# Patient Record
Sex: Male | Born: 2000 | Hispanic: No | Marital: Single | Smoking: Never smoker
Health system: Southern US, Community
[De-identification: ages and names within clinical notes are randomized; demographics above are authoritative.]

---
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
## 2018-07-31 IMAGING — CT CT CERVICAL SPINE W/O CM
3 of 5 series · 13 of 33 positions shown, 15 images · non-contrast
Comparison: None.

CLINICAL DATA: Neck injury on boat 2 days ago. Initial encounter.

EXAM:
CT CERVICAL SPINE WITHOUT CONTRAST
TECHNIQUE: Multidetector CT imaging of the cervical spine was performed without
intravenous contrast. Multiplanar CT image reconstructions were also
generated.

[Series 4: c_spine 2.0 st · axial · 0.37mm/px · z∈[+997,+1129]mm · 5 of 100 slices shown, 7 images]
[im 17/100  soft-tissue]
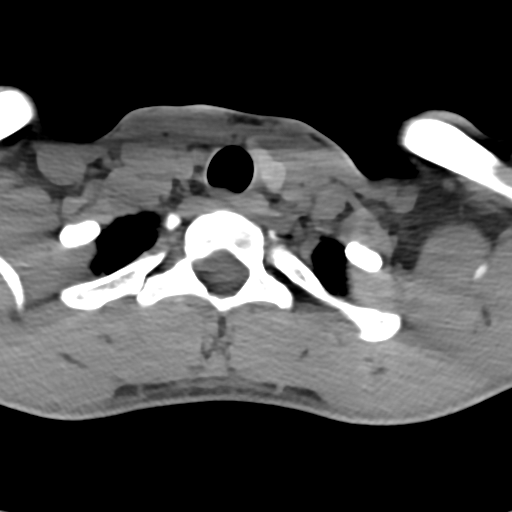
[im 17/100  bone]
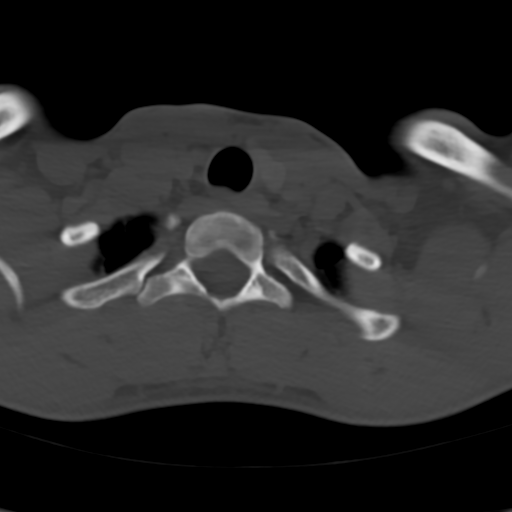
[im 34/100  bone]
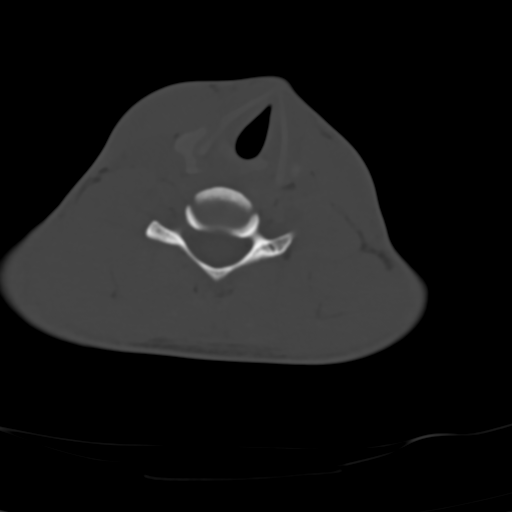
[im 50/100  bone]
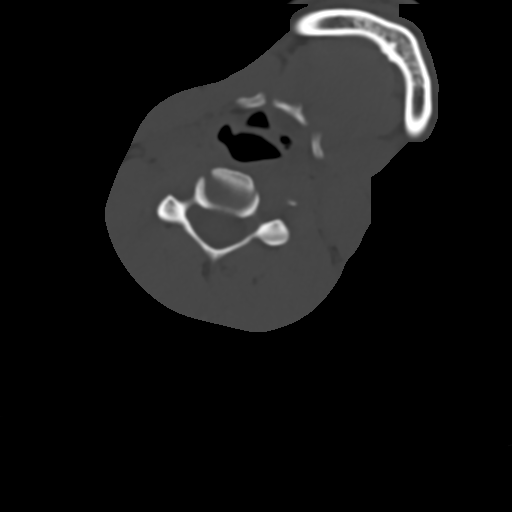
[im 67/100  bone]
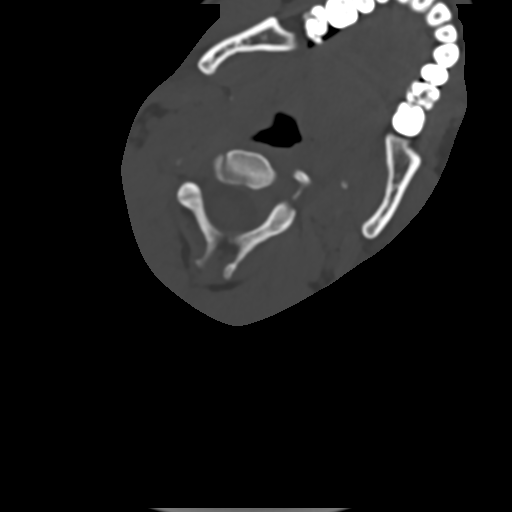
[im 83/100  soft-tissue]
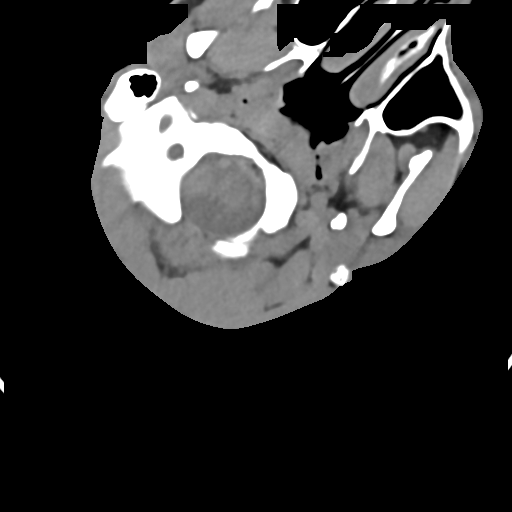
[im 83/100  bone]
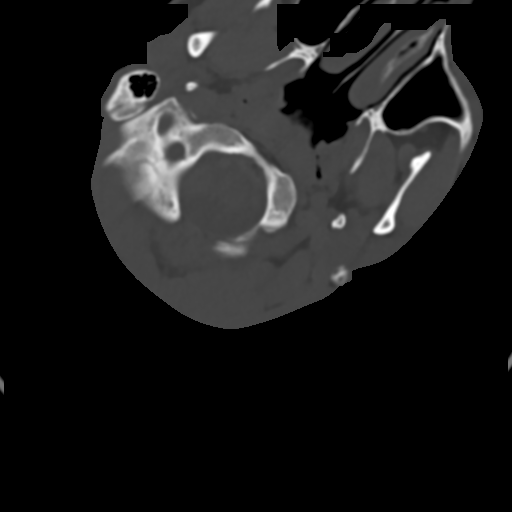

[Series 9: c_spine 2.0 cor bone · coronal · 0.26mm/px · 3 of 52 slices shown]
[im 11/52  bone]
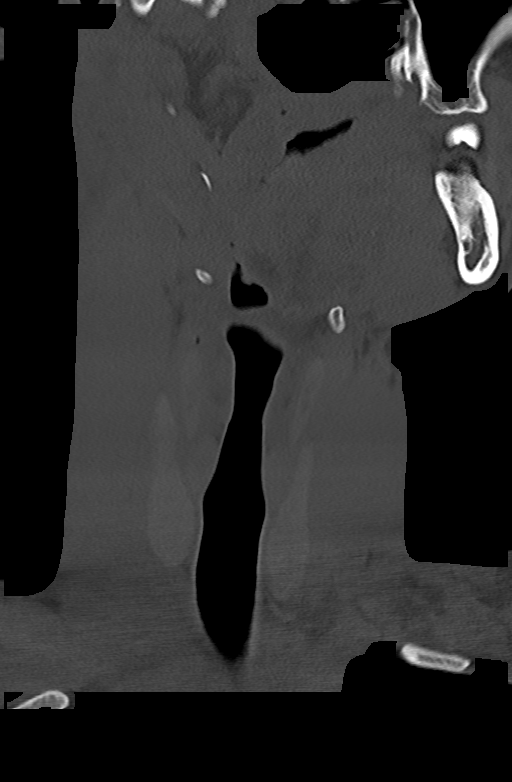
[im 21/52  bone]
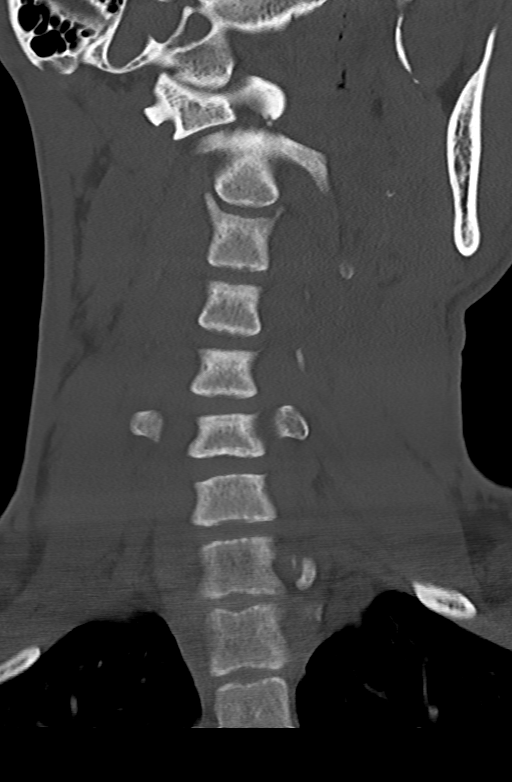
[im 31/52  bone]
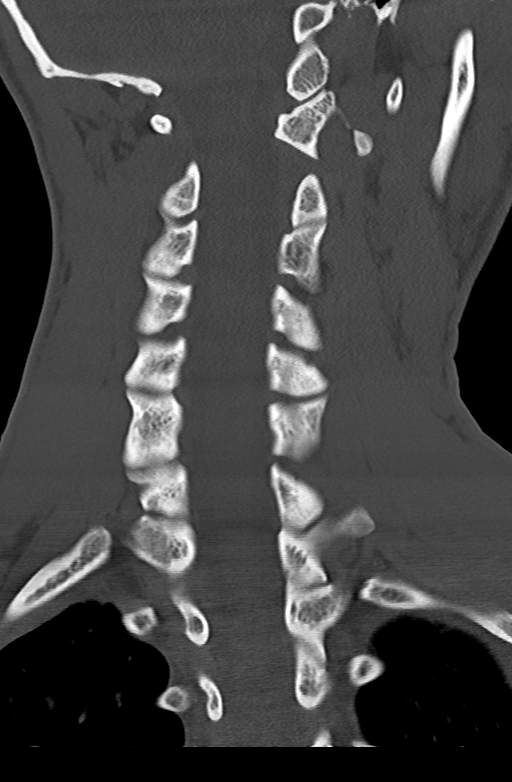

[Series 12: c_spine 2.0 sag bonelower · sagittal · 0.25mm/px · 5 of 57 slices shown]
[im 10/57  bone]
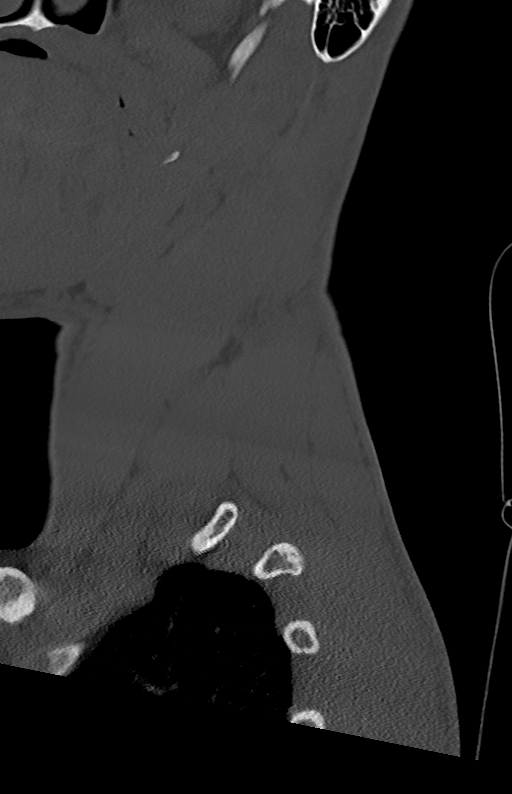
[im 19/57  bone]
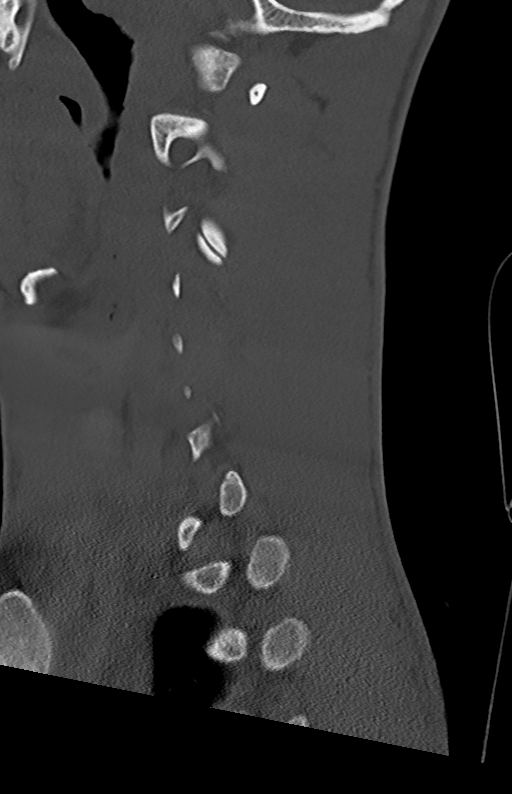
[im 29/57  bone]
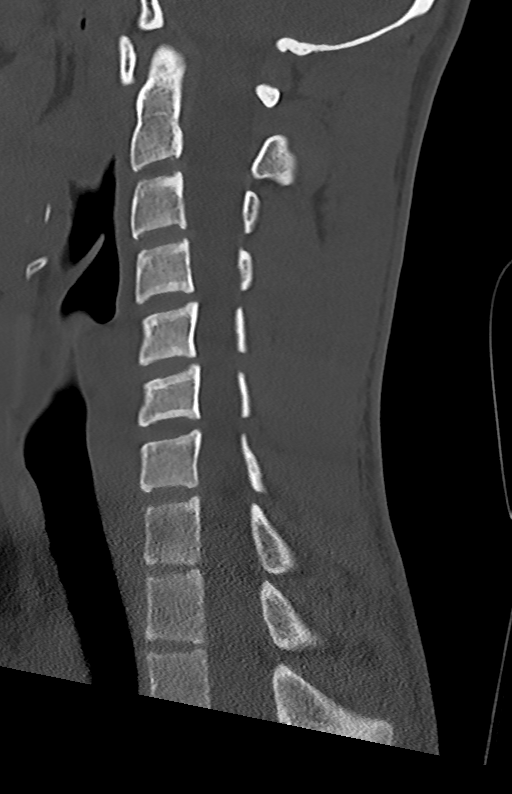
[im 38/57  bone]
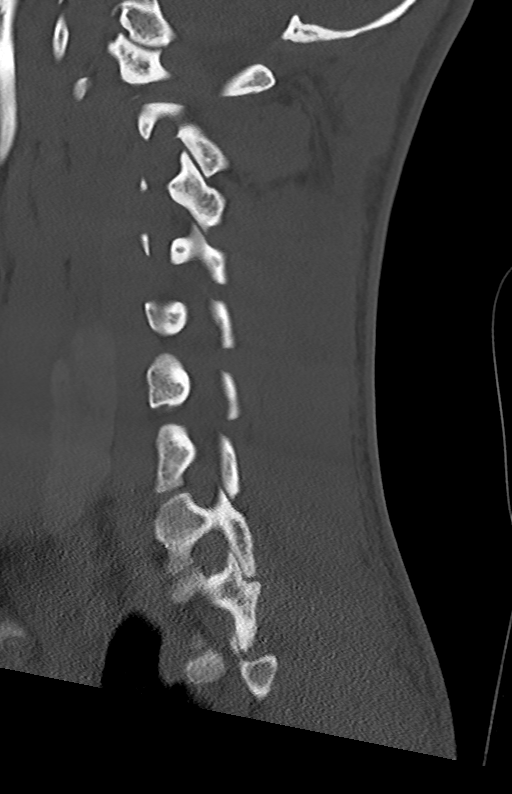
[im 47/57  bone]
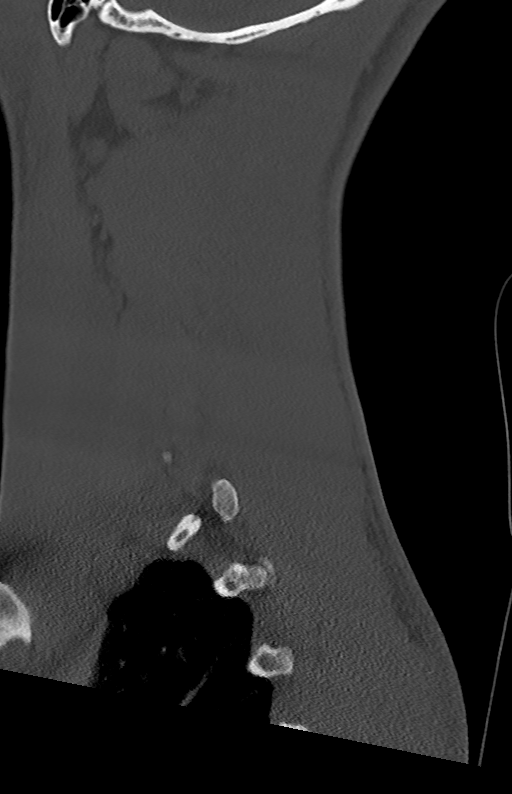

[13 of 33 positions shown; findings below may reference images not displayed]

FINDINGS: Alignment: C1 is rotated on C2 to the left with anterior subluxation
of the right facet and posterior subluxation of the left facet
measuring up to 7 mm (measurement taken on sagittal reformats
between the anterior margins of the right articular surfaces). The
predental space is normal diameter (2 mm) and lateral atlantodental
spaces are symmetric. Normal atlanto occipital alignment.

Skull base and vertebrae: Negative for fracture.

Soft tissues and spinal canal: No prevertebral fluid or swelling. No
visible canal hematoma.

Disc levels:  No degenerative changes

Upper chest: Negative
IMPRESSION: Assuming fixed torticollis, there are findings of atlantoaxial
rotatory displacement without listhesis or fracture.

## 2018-11-13 IMAGING — DX DG ANKLE COMPLETE 3+V*R*
3 series · 4 of 4 positions shown · non-contrast
Comparison: None.

CLINICAL DATA: Pain at lateral RIGHT ankle and heel after jumping
off the back of his truck yesterday

EXAM:
RIGHT ANKLE - COMPLETE 3+ VIEW

[dg ankle complete right (1 of 3)]
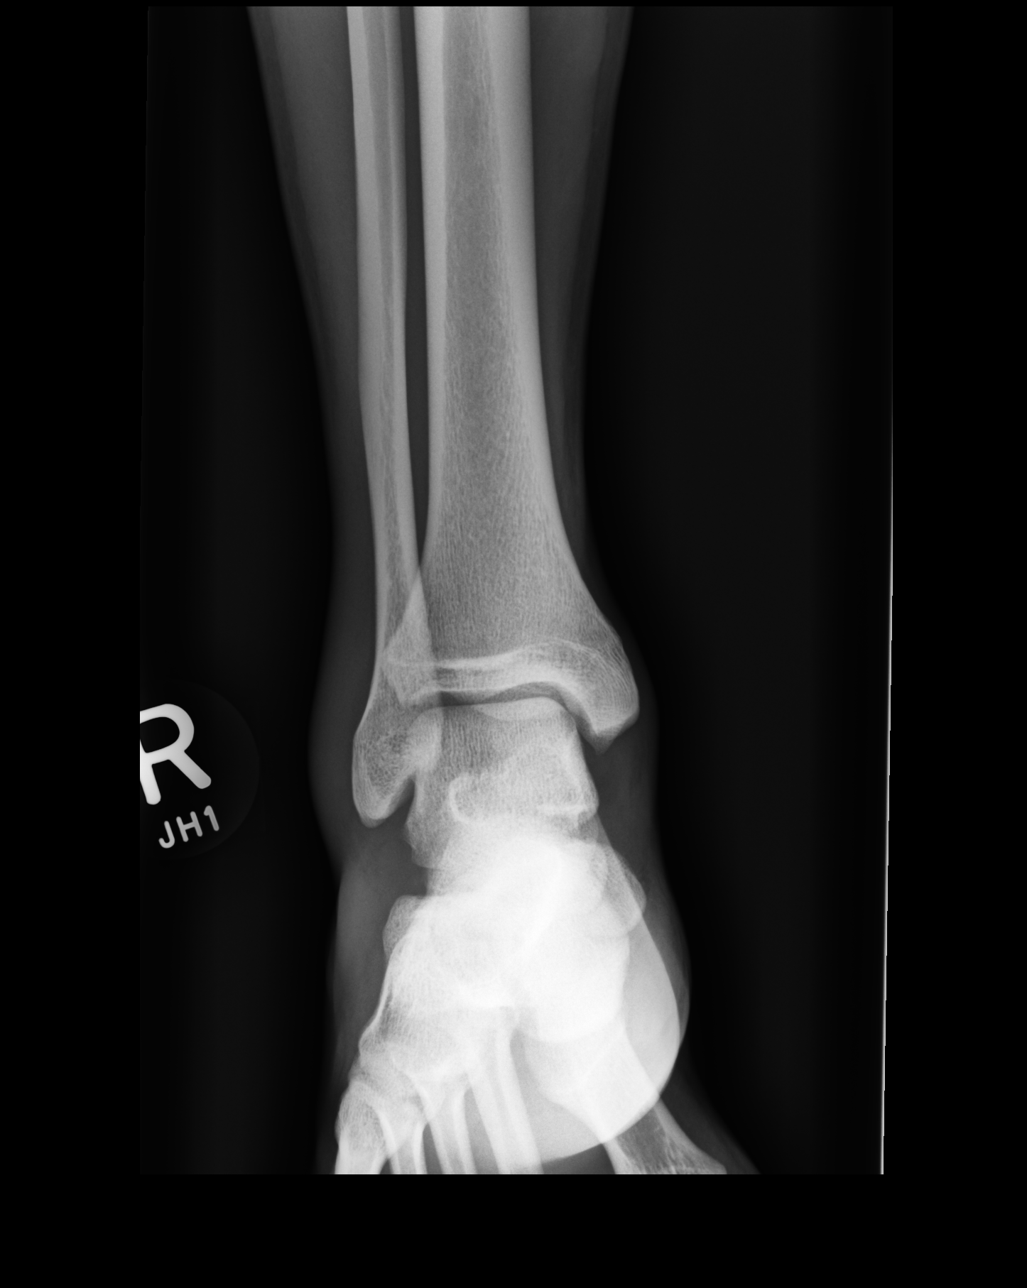

[dg ankle complete right (2 of 3)]
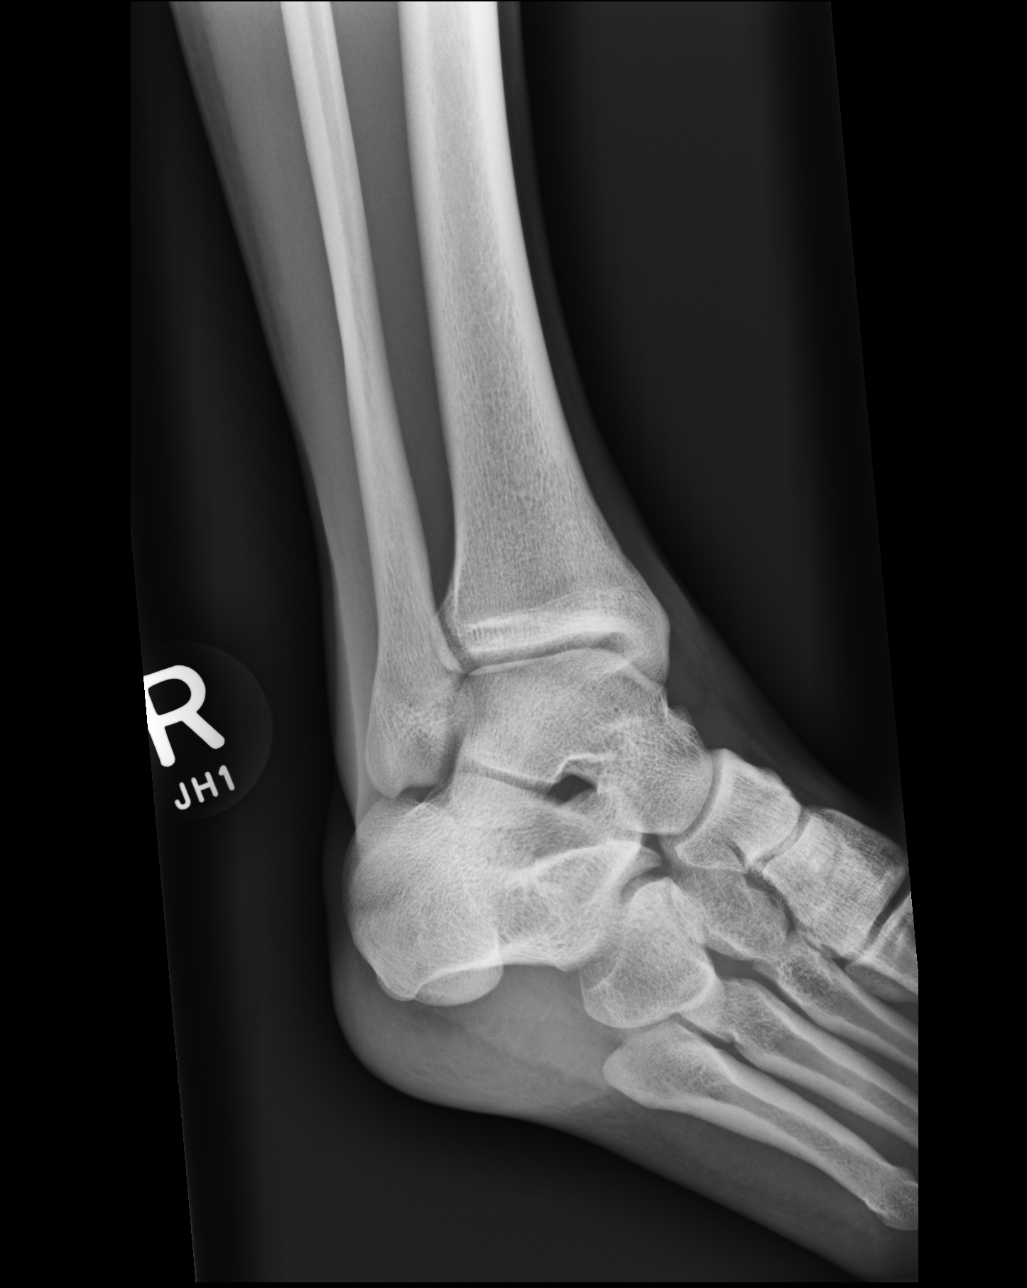

[Series 3: dg ankle complete right · right · 0.14mm/px · 2 of 2 slices shown (3 of 3)]
[im 1/2]
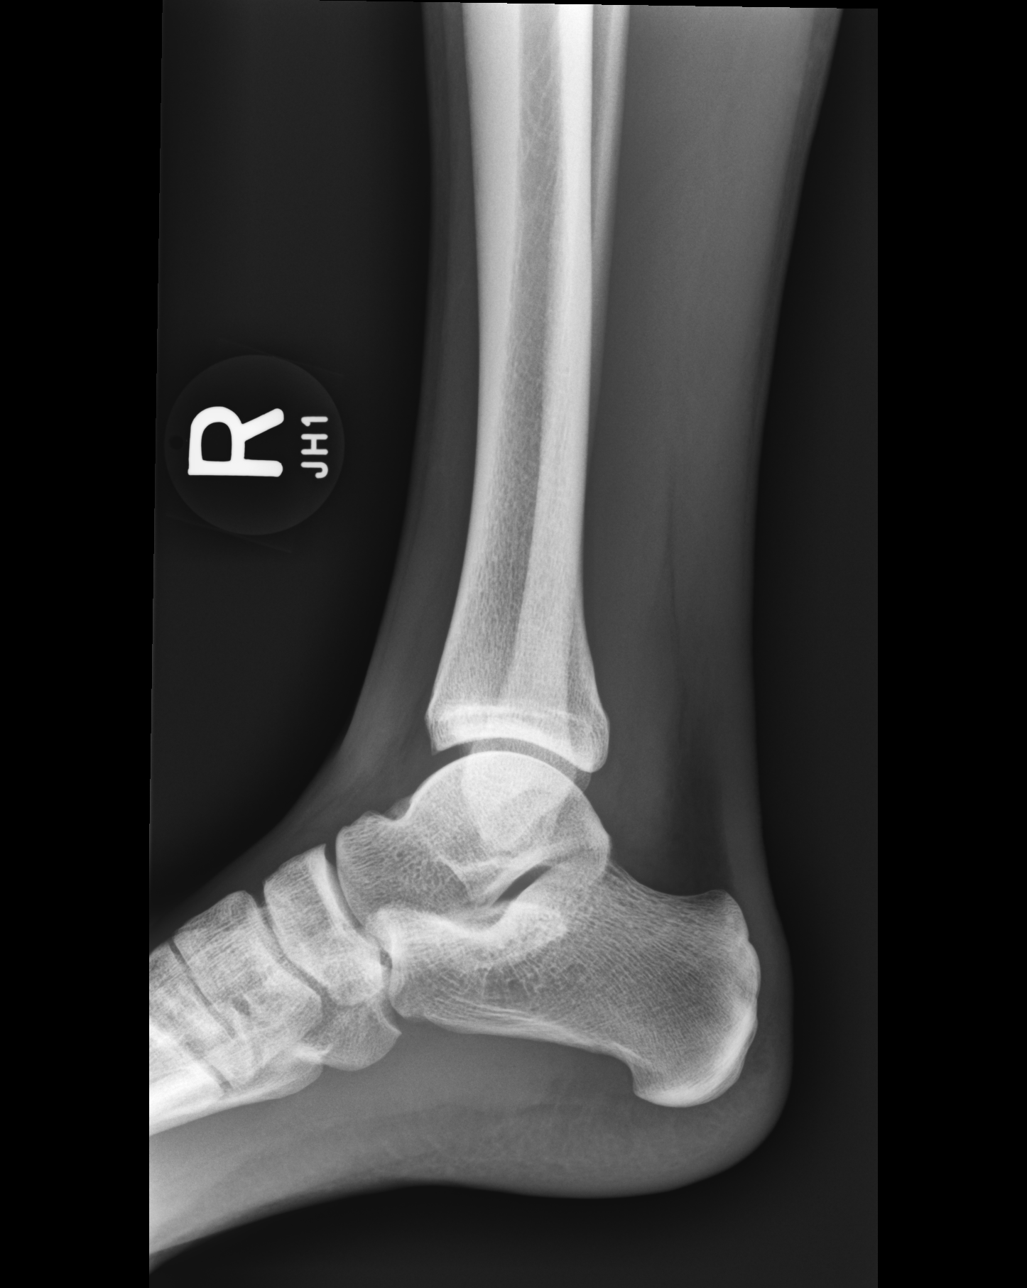
[im 2/2]
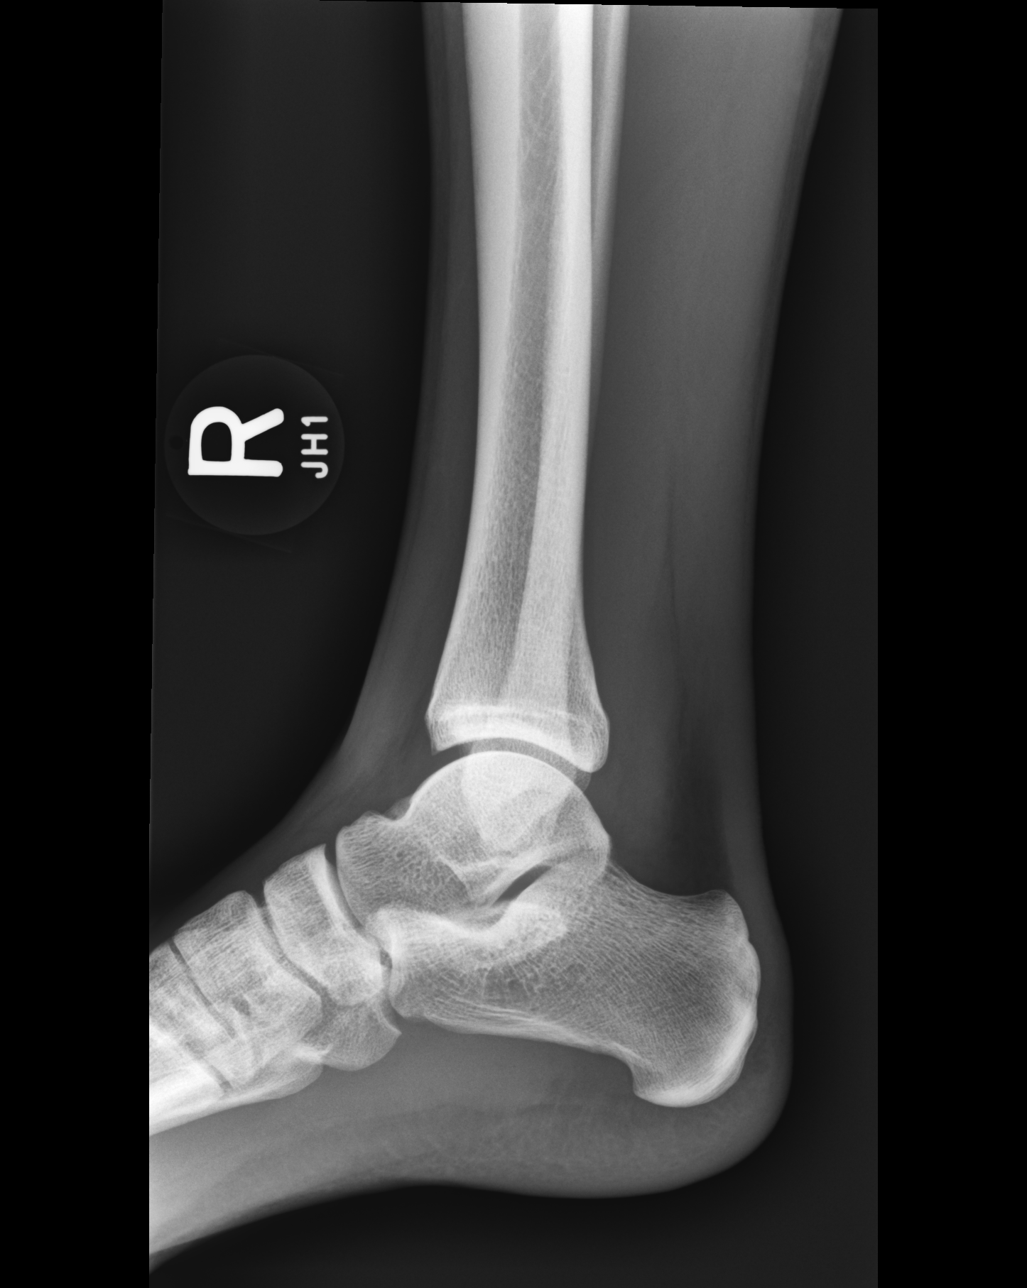

[4 of 4 positions shown; findings below may reference images not displayed]

FINDINGS: Soft tissue swelling laterally.

Osseous mineralization normal.

Joint spaces preserved.

No acute fracture, dislocation, or bone destruction.
IMPRESSION: No acute osseous abnormalities.
